# Patient Record
Sex: Male | Born: 1960 | Race: White | Hispanic: No | Marital: Married | Smoking: Never smoker
Health system: Southern US, Community
[De-identification: ages and names within clinical notes are randomized; demographics above are authoritative.]

## PROBLEM LIST (undated history)

## (undated) DIAGNOSIS — Z87442 Personal history of urinary calculi: Secondary | ICD-10-CM

## (undated) DIAGNOSIS — Z9889 Other specified postprocedural states: Secondary | ICD-10-CM

## (undated) DIAGNOSIS — J45909 Unspecified asthma, uncomplicated: Secondary | ICD-10-CM

## (undated) DIAGNOSIS — T8859XA Other complications of anesthesia, initial encounter: Secondary | ICD-10-CM

## (undated) DIAGNOSIS — T4145XA Adverse effect of unspecified anesthetic, initial encounter: Secondary | ICD-10-CM

## (undated) DIAGNOSIS — R112 Nausea with vomiting, unspecified: Secondary | ICD-10-CM

## (undated) HISTORY — PX: VARICOCELECTOMY: SHX1084

---

## 1998-07-17 HISTORY — PX: HERNIA REPAIR: SHX51

## 2001-07-17 HISTORY — PX: ANKLE SURGERY: SHX546

## 2003-11-18 ENCOUNTER — Encounter: Admission: RE | Admit: 2003-11-18 | Discharge: 2003-11-18 | Payer: Self-pay | Admitting: General Surgery

## 2003-11-19 ENCOUNTER — Ambulatory Visit (HOSPITAL_COMMUNITY): Admission: RE | Admit: 2003-11-19 | Discharge: 2003-11-19 | Payer: Self-pay | Admitting: General Surgery

## 2003-11-19 ENCOUNTER — Ambulatory Visit (HOSPITAL_BASED_OUTPATIENT_CLINIC_OR_DEPARTMENT_OTHER): Admission: RE | Admit: 2003-11-19 | Discharge: 2003-11-19 | Payer: Self-pay | Admitting: General Surgery

## 2007-07-18 HISTORY — PX: HERNIA REPAIR: SHX51

## 2007-12-20 ENCOUNTER — Encounter: Admission: RE | Admit: 2007-12-20 | Discharge: 2007-12-20 | Payer: Self-pay | Admitting: Internal Medicine

## 2010-12-02 NOTE — Op Note (Signed)
[name], [name]                          ACCOUNT NO.:  [id number]   MEDICAL RECORD NO.:  [id number]                   PATIENT TYPE:  AMB   LOCATION:  DSC                                  FACILITY:  [facility]   PHYSICIAN:  Ollen Gross. Vernell Morgans, M.D.              DATE OF BIRTH:  [birth year]   DATE OF PROCEDURE:  11/19/2003  DATE OF DISCHARGE:                                 OPERATIVE REPORT   PREOPERATIVE DIAGNOSIS:  Right inguinal hernia.   POSTOPERATIVE DIAGNOSIS:  Right direct inguinal hernia.   PROCEDURE:  Right inguinal hernia repair with mesh.   SURGEON:  Ollen Gross. Carolynne Edouard, M.D.   ANESTHESIA:  General endotracheal anesthesia.   PROCEDURE:  After informed consent was obtained, the patient was brought to  the operating room and placed in the supine position on the operating table.  After induction of general anesthesia, the patient's abdomen and right groin  were prepped with Betadine and draped in the usual sterile manner.  The area  of the right groin was infiltrated with 0.25% Marcaine with epinephrine.  A  small incision was made from the edge of the pubic tubercle on the right  towards the anterior superior iliac spine for a distance of about 5 cm.  This incision was carried down through the skin and subcutaneous tissue  sharply with electrocautery until the fascia of the external oblique was  encountered.  The external oblique fascia was opened along its fibers using  Metzenbaum scissors to the apex of the external ring.  A Weitlaner retractor  was then placed to retract the fascial layer superiorly and inferiorly.  Blunt dissection was carried out around the cord structures at the edge of  the pubic tubercle bluntly between fingers until the cord could be  surrounded between two fingers.  A 1/2 inch Penrose drain was then placed  around the cord structures for retraction purposes.  The cord was  skeletonized and there was no indirect component of hernia.  There was an  obvious  weakness of the floor of the inguinal canal and a broad based hernia  bulging through.  The hernia was able to be reduced and the floor of the  inguinal canal was repaired with interrupted 2-0 Vicryl stitches.  A piece  of 3 by 6 mesh was then cut to fit with tails cut laterally in the mesh.  The mesh was sewn inferiorly to the shelving edge of the inguinal ligament  using a running 2-0 Prolene stitch.  Superiorly, the muscle was sewn to the  muscular aponeurotic fascial layer of the transversalis using interrupted  vertical mattress 2-0 Prolene stitches, the tails were wrapped around the  cord structures and anchored laterally to the shelving edge of the inguinal  ligament with interrupted 2-0 Prolene stitch.  Once this was accomplished,  the repair was intact without any tension.  The mesh was in good position.  The wound was copiously irrigated with saline.  The external oblique fascia  was then reapproximated with a running 2-0 Vicryl stitch.  The wound was  infiltrated with the rest of the 0.25% Marcaine with epinephrine and the  subcutaneous fascia was closed with running 3-0 Vicryl.  The skin incision  was closed with a running 4-0 Monocryl subcuticular stitch.  Benzoin, Steri-  Strips, and sterile dressings were applied.  The patient tolerated the  procedure well.  At the end of the case, all needle, sponge, and instrument  counts were correct.  The patient was awakened and taken to the recovery  room in stable condition.                                               Ollen Gross. Vernell Morgans, M.D.    PST/MEDQ  D:  11/19/2003  T:  11/19/2003  Job:  [id number]

## 2013-09-16 ENCOUNTER — Other Ambulatory Visit: Payer: Self-pay | Admitting: Internal Medicine

## 2013-09-16 DIAGNOSIS — N6310 Unspecified lump in the right breast, unspecified quadrant: Secondary | ICD-10-CM

## 2013-09-24 ENCOUNTER — Ambulatory Visit
Admission: RE | Admit: 2013-09-24 | Discharge: 2013-09-24 | Disposition: A | Discharge status: Home / Self Care | Payer: Commercial Managed Care - PPO | Source: Ambulatory Visit | Attending: Internal Medicine | Admitting: Internal Medicine

## 2013-09-24 DIAGNOSIS — N6310 Unspecified lump in the right breast, unspecified quadrant: Secondary | ICD-10-CM

## 2016-05-15 ENCOUNTER — Other Ambulatory Visit (HOSPITAL_COMMUNITY): Payer: Self-pay | Admitting: Gastroenterology

## 2016-05-15 DIAGNOSIS — R1310 Dysphagia, unspecified: Secondary | ICD-10-CM

## 2016-05-23 ENCOUNTER — Ambulatory Visit (HOSPITAL_COMMUNITY)
Admission: RE | Admit: 2016-05-23 | Discharge: 2016-05-23 | Disposition: A | Discharge status: Home / Self Care | Payer: Commercial Managed Care - PPO | Source: Ambulatory Visit | Attending: Gastroenterology | Admitting: Gastroenterology

## 2016-05-23 DIAGNOSIS — R1310 Dysphagia, unspecified: Secondary | ICD-10-CM | POA: Diagnosis not present

## 2016-11-13 ENCOUNTER — Other Ambulatory Visit: Payer: Self-pay | Admitting: Urology

## 2016-11-21 ENCOUNTER — Encounter (HOSPITAL_COMMUNITY): Payer: Self-pay | Admitting: *Deleted

## 2016-11-23 ENCOUNTER — Encounter (HOSPITAL_COMMUNITY)
Admission: RE | Disposition: A | Discharge status: Home / Self Care | Payer: Self-pay | Source: Ambulatory Visit | Attending: Urology

## 2016-11-23 ENCOUNTER — Ambulatory Visit (HOSPITAL_COMMUNITY)
Admission: RE | Admit: 2016-11-23 | Discharge: 2016-11-23 | Disposition: A | Discharge status: Home / Self Care | Payer: Commercial Managed Care - PPO | Source: Ambulatory Visit | Attending: Urology | Admitting: Urology

## 2016-11-23 ENCOUNTER — Ambulatory Visit (HOSPITAL_COMMUNITY): Payer: Commercial Managed Care - PPO

## 2016-11-23 ENCOUNTER — Encounter (HOSPITAL_COMMUNITY): Payer: Self-pay

## 2016-11-23 DIAGNOSIS — J45909 Unspecified asthma, uncomplicated: Secondary | ICD-10-CM | POA: Diagnosis not present

## 2016-11-23 DIAGNOSIS — N200 Calculus of kidney: Secondary | ICD-10-CM | POA: Diagnosis not present

## 2016-11-23 DIAGNOSIS — Z7982 Long term (current) use of aspirin: Secondary | ICD-10-CM | POA: Diagnosis not present

## 2016-11-23 HISTORY — DX: Unspecified asthma, uncomplicated: J45.909

## 2016-11-23 HISTORY — DX: Nausea with vomiting, unspecified: R11.2

## 2016-11-23 HISTORY — DX: Personal history of urinary calculi: Z87.442

## 2016-11-23 HISTORY — DX: Adverse effect of unspecified anesthetic, initial encounter: T41.45XA

## 2016-11-23 HISTORY — DX: Other complications of anesthesia, initial encounter: T88.59XA

## 2016-11-23 HISTORY — PX: EXTRACORPOREAL SHOCK WAVE LITHOTRIPSY: SHX1557

## 2016-11-23 HISTORY — DX: Other specified postprocedural states: Z98.890

## 2016-11-23 SURGERY — LITHOTRIPSY, ESWL
Anesthesia: LOCAL | Laterality: Right

## 2016-11-23 MED ORDER — DIPHENHYDRAMINE HCL 25 MG PO CAPS
25.0000 mg | ORAL_CAPSULE | ORAL | Status: AC
  Administered 2016-11-23: 25 mg via ORAL
  Filled 2016-11-23: qty 1

## 2016-11-23 MED ORDER — SODIUM CHLORIDE 0.9 % IV SOLN
INTRAVENOUS | Status: DC
  Administered 2016-11-23: 08:00:00 via INTRAVENOUS

## 2016-11-23 MED ORDER — LEVOFLOXACIN 500 MG PO TABS
500.0000 mg | ORAL_TABLET | ORAL | Status: AC
  Administered 2016-11-23: 500 mg via ORAL
  Filled 2016-11-23: qty 1

## 2016-11-23 MED ORDER — DIAZEPAM 5 MG PO TABS
10.0000 mg | ORAL_TABLET | ORAL | Status: AC
  Administered 2016-11-23: 10 mg via ORAL
  Filled 2016-11-23: qty 2

## 2016-11-23 NOTE — Discharge Instructions (Signed)
Lithotripsy, Care After °This sheet gives you information about how to care for yourself after your procedure. Your health care provider may also give you more specific instructions. If you have problems or questions, contact your health care provider. °What can I expect after the procedure? °After the procedure, it is common to have: °· Some blood in your urine. This should only last for a few days. °· Soreness in your back, sides, or upper abdomen for a few days. °· Blotches or bruises on your back where the pressure wave entered the skin. °· Pain, discomfort, or nausea when pieces (fragments) of the kidney stone move through the tube that carries urine from the kidney to the bladder (ureter). Stone fragments may pass soon after the procedure, but they may continue to pass for up to 4-8 weeks. °? If you have severe pain or nausea, contact your health care provider. This may be caused by a large stone that was not broken up, and this may mean that you need more treatment. °· Some pain or discomfort during urination. °· Some pain or discomfort in the lower abdomen or (in men) at the base of the penis. ° °Follow these instructions at home: °Medicines °· Take over-the-counter and prescription medicines only as told by your health care provider. °· If you were prescribed an antibiotic medicine, take it as told by your health care provider. Do not stop taking the antibiotic even if you start to feel better. °· Do not drive for 24 hours if you were given a medicine to help you relax (sedative). °· Do not drive or use heavy machinery while taking prescription pain medicine. °Eating and drinking °· Drink enough water and fluids to keep your urine clear or pale yellow. This helps any remaining pieces of the stone to pass. It can also help prevent new stones from forming. °· Eat plenty of fresh fruits and vegetables. °· Follow instructions from your health care provider about eating and drinking restrictions. You may be  instructed: °? To reduce how much salt (sodium) you eat or drink. Check ingredients and nutrition facts on packaged foods and beverages. °? To reduce how much meat you eat. °· Eat the recommended amount of calcium for your age and gender. Ask your health care provider how much calcium you should have. °General instructions °· Get plenty of rest. °· Most people can resume normal activities 1-2 days after the procedure. Ask your health care provider what activities are safe for you. °· If directed, strain all urine through the strainer that was provided by your health care provider. °? Keep all fragments for your health care provider to see. Any stones that are found may be sent to a medical lab for examination. The stone may be as small as a grain of salt. °· Keep all follow-up visits as told by your health care provider. This is important. °Contact a health care provider if: °· You have pain that is severe or does not get better with medicine. °· You have nausea that is severe or does not go away. °· You have blood in your urine longer than your health care provider told you to expect. °· You have more blood in your urine. °· You have pain during urination that does not go away. °· You urinate more frequently than usual and this does not go away. °· You develop a rash or any other possible signs of an allergic reaction. °Get help right away if: °· You have severe pain in   your back, sides, or upper abdomen. °· You have severe pain while urinating. °· Your urine is very dark red. °· You have blood in your stool (feces). °· You cannot pass any urine at all. °· You feel a strong urge to urinate after emptying your bladder. °· You have a fever or chills. °· You develop shortness of breath, difficulty breathing, or chest pain. °· You have severe nausea that leads to persistent vomiting. °· You faint. °Summary °· After this procedure, it is common to have some pain, discomfort, or nausea when pieces (fragments) of the  kidney stone move through the tube that carries urine from the kidney to the bladder (ureter). If this pain or nausea is severe, however, you should contact your health care provider. °· Most people can resume normal activities 1-2 days after the procedure. Ask your health care provider what activities are safe for you. °· Drink enough water and fluids to keep your urine clear or pale yellow. This helps any remaining pieces of the stone to pass, and it can help prevent new stones from forming. °· If directed, strain your urine and keep all fragments for your health care provider to see. Fragments or stones may be as small as a grain of salt. °· Get help right away if you have severe pain in your back, sides, or upper abdomen or have severe pain while urinating. °This information is not intended to replace advice given to you by your health care provider. Make sure you discuss any questions you have with your health care provider. °Document Released: 07/23/2007 Document Revised: 05/24/2016 Document Reviewed: 05/24/2016 °Elsevier Interactive Patient Education © 2017 [employer] ° °

## 2016-11-24 ENCOUNTER — Encounter (HOSPITAL_COMMUNITY): Payer: Self-pay | Admitting: Urology

## 2018-01-02 DIAGNOSIS — R310 Gross hematuria: Secondary | ICD-10-CM | POA: Diagnosis not present

## 2018-01-02 DIAGNOSIS — N200 Calculus of kidney: Secondary | ICD-10-CM | POA: Diagnosis not present

## 2018-01-30 DIAGNOSIS — N200 Calculus of kidney: Secondary | ICD-10-CM | POA: Diagnosis not present

## 2018-01-30 DIAGNOSIS — R310 Gross hematuria: Secondary | ICD-10-CM | POA: Diagnosis not present

## 2018-03-08 DIAGNOSIS — R82998 Other abnormal findings in urine: Secondary | ICD-10-CM | POA: Diagnosis not present

## 2018-03-20 DIAGNOSIS — Z23 Encounter for immunization: Secondary | ICD-10-CM | POA: Diagnosis not present

## 2018-03-26 DIAGNOSIS — Z1212 Encounter for screening for malignant neoplasm of rectum: Secondary | ICD-10-CM | POA: Diagnosis not present

## 2018-07-31 DIAGNOSIS — N401 Enlarged prostate with lower urinary tract symptoms: Secondary | ICD-10-CM | POA: Diagnosis not present

## 2018-07-31 DIAGNOSIS — N200 Calculus of kidney: Secondary | ICD-10-CM | POA: Diagnosis not present

## 2018-07-31 DIAGNOSIS — R3912 Poor urinary stream: Secondary | ICD-10-CM | POA: Diagnosis not present

## 2019-07-28 ENCOUNTER — Ambulatory Visit (HOSPITAL_COMMUNITY): Payer: Managed Care, Other (non HMO) | Admitting: Certified Registered Nurse Anesthetist

## 2019-07-28 ENCOUNTER — Other Ambulatory Visit: Payer: Self-pay

## 2019-07-28 ENCOUNTER — Other Ambulatory Visit: Payer: Self-pay | Admitting: Gastroenterology

## 2019-07-28 ENCOUNTER — Encounter (HOSPITAL_COMMUNITY)
Admission: RE | Disposition: A | Discharge status: Home / Self Care | Payer: Self-pay | Source: Other Acute Inpatient Hospital | Attending: Gastroenterology

## 2019-07-28 ENCOUNTER — Encounter (HOSPITAL_COMMUNITY): Payer: Self-pay | Admitting: Gastroenterology

## 2019-07-28 ENCOUNTER — Ambulatory Visit (HOSPITAL_COMMUNITY)
Admission: RE | Admit: 2019-07-28 | Discharge: 2019-07-28 | Disposition: A | Discharge status: Home / Self Care | Payer: Managed Care, Other (non HMO) | Source: Other Acute Inpatient Hospital | Attending: Gastroenterology | Admitting: Gastroenterology

## 2019-07-28 DIAGNOSIS — K449 Diaphragmatic hernia without obstruction or gangrene: Secondary | ICD-10-CM | POA: Diagnosis not present

## 2019-07-28 DIAGNOSIS — K209 Esophagitis, unspecified without bleeding: Secondary | ICD-10-CM | POA: Diagnosis not present

## 2019-07-28 DIAGNOSIS — Z79899 Other long term (current) drug therapy: Secondary | ICD-10-CM | POA: Diagnosis not present

## 2019-07-28 DIAGNOSIS — Z87442 Personal history of urinary calculi: Secondary | ICD-10-CM | POA: Insufficient documentation

## 2019-07-28 DIAGNOSIS — Z7982 Long term (current) use of aspirin: Secondary | ICD-10-CM | POA: Insufficient documentation

## 2019-07-28 DIAGNOSIS — R1310 Dysphagia, unspecified: Secondary | ICD-10-CM | POA: Diagnosis not present

## 2019-07-28 DIAGNOSIS — J45909 Unspecified asthma, uncomplicated: Secondary | ICD-10-CM | POA: Diagnosis not present

## 2019-07-28 DIAGNOSIS — X58XXXA Exposure to other specified factors, initial encounter: Secondary | ICD-10-CM | POA: Diagnosis not present

## 2019-07-28 DIAGNOSIS — F419 Anxiety disorder, unspecified: Secondary | ICD-10-CM | POA: Diagnosis not present

## 2019-07-28 DIAGNOSIS — K760 Fatty (change of) liver, not elsewhere classified: Secondary | ICD-10-CM | POA: Diagnosis not present

## 2019-07-28 DIAGNOSIS — E785 Hyperlipidemia, unspecified: Secondary | ICD-10-CM | POA: Diagnosis not present

## 2019-07-28 DIAGNOSIS — T18128A Food in esophagus causing other injury, initial encounter: Secondary | ICD-10-CM | POA: Diagnosis not present

## 2019-07-28 DIAGNOSIS — R000 Tachycardia, unspecified: Secondary | ICD-10-CM | POA: Diagnosis not present

## 2019-07-28 DIAGNOSIS — K117 Disturbances of salivary secretion: Secondary | ICD-10-CM | POA: Insufficient documentation

## 2019-07-28 DIAGNOSIS — Z800 Family history of malignant neoplasm of digestive organs: Secondary | ICD-10-CM | POA: Insufficient documentation

## 2019-07-28 DIAGNOSIS — F329 Major depressive disorder, single episode, unspecified: Secondary | ICD-10-CM | POA: Insufficient documentation

## 2019-07-28 DIAGNOSIS — Z8249 Family history of ischemic heart disease and other diseases of the circulatory system: Secondary | ICD-10-CM | POA: Diagnosis not present

## 2019-07-28 DIAGNOSIS — Y939 Activity, unspecified: Secondary | ICD-10-CM | POA: Diagnosis not present

## 2019-07-28 HISTORY — PX: ESOPHAGOGASTRODUODENOSCOPY (EGD) WITH PROPOFOL: SHX5813

## 2019-07-28 HISTORY — PX: FOREIGN BODY REMOVAL: SHX962

## 2019-07-28 SURGERY — ESOPHAGOGASTRODUODENOSCOPY (EGD) WITH PROPOFOL
Anesthesia: General

## 2019-07-28 MED ORDER — SCOPOLAMINE 1 MG/3DAYS TD PT72
MEDICATED_PATCH | TRANSDERMAL | Status: AC
  Filled 2019-07-28: qty 1

## 2019-07-28 MED ORDER — PHENYLEPHRINE 40 MCG/ML (10ML) SYRINGE FOR IV PUSH (FOR BLOOD PRESSURE SUPPORT)
PREFILLED_SYRINGE | INTRAVENOUS | Status: DC | PRN
  Administered 2019-07-28 (×2): 120 ug via INTRAVENOUS
  Administered 2019-07-28 (×2): 80 ug via INTRAVENOUS

## 2019-07-28 MED ORDER — DIPHENHYDRAMINE HCL 50 MG/ML IJ SOLN
INTRAMUSCULAR | Status: DC | PRN
  Administered 2019-07-28: 12.5 mg via INTRAVENOUS

## 2019-07-28 MED ORDER — PROPOFOL 10 MG/ML IV BOLUS
INTRAVENOUS | Status: AC
  Filled 2019-07-28: qty 20

## 2019-07-28 MED ORDER — FENTANYL CITRATE (PF) 100 MCG/2ML IJ SOLN
INTRAMUSCULAR | Status: AC
  Filled 2019-07-28: qty 2

## 2019-07-28 MED ORDER — ONDANSETRON HCL 4 MG/2ML IJ SOLN
INTRAMUSCULAR | Status: DC | PRN
  Administered 2019-07-28: 4 mg via INTRAVENOUS

## 2019-07-28 MED ORDER — DEXAMETHASONE SODIUM PHOSPHATE 10 MG/ML IJ SOLN
INTRAMUSCULAR | Status: DC | PRN
  Administered 2019-07-28: 10 mg via INTRAVENOUS

## 2019-07-28 MED ORDER — LACTATED RINGERS IV SOLN
INTRAVENOUS | Status: DC
  Administered 2019-07-28: 1000 mL via INTRAVENOUS

## 2019-07-28 MED ORDER — PROPOFOL 500 MG/50ML IV EMUL
INTRAVENOUS | Status: AC
  Filled 2019-07-28: qty 100

## 2019-07-28 MED ORDER — PROPOFOL 500 MG/50ML IV EMUL
INTRAVENOUS | Status: DC | PRN
  Administered 2019-07-28: 150 ug/kg/min via INTRAVENOUS

## 2019-07-28 MED ORDER — LIDOCAINE 2% (20 MG/ML) 5 ML SYRINGE
INTRAMUSCULAR | Status: DC | PRN
  Administered 2019-07-28: 80 mg via INTRAVENOUS

## 2019-07-28 MED ORDER — PROPOFOL 10 MG/ML IV BOLUS
INTRAVENOUS | Status: DC | PRN
  Administered 2019-07-28: 180 mg via INTRAVENOUS

## 2019-07-28 MED ORDER — SUCCINYLCHOLINE CHLORIDE 200 MG/10ML IV SOSY
PREFILLED_SYRINGE | INTRAVENOUS | Status: DC | PRN
  Administered 2019-07-28: 100 mg via INTRAVENOUS

## 2019-07-28 MED ORDER — SCOPOLAMINE 1 MG/3DAYS TD PT72
1.0000 | MEDICATED_PATCH | TRANSDERMAL | Status: DC
  Administered 2019-07-28: 1.5 mg via TRANSDERMAL

## 2019-07-28 MED ORDER — OMEPRAZOLE 20 MG PO CPDR: 20.0000 mg | DELAYED_RELEASE_CAPSULE | Freq: Every day | ORAL | 1 refills | Status: AC

## 2019-07-28 MED ORDER — ESMOLOL HCL 100 MG/10ML IV SOLN
INTRAVENOUS | Status: DC | PRN
  Administered 2019-07-28: 20 ug via INTRAVENOUS

## 2019-07-28 MED ORDER — FENTANYL CITRATE (PF) 100 MCG/2ML IJ SOLN
INTRAMUSCULAR | Status: DC | PRN
  Administered 2019-07-28: 50 ug via INTRAVENOUS

## 2019-07-28 SURGICAL SUPPLY — 15 items

## 2019-07-28 NOTE — Discharge Instructions (Signed)
Endoscopy Care After Please read the instructions outlined below and refer to this sheet in the next few weeks. These discharge instructions provide you with general information on caring for yourself after you leave the hospital. Your doctor may also give you specific instructions. While your treatment has been planned according to the most current medical practices available, unavoidable complications occasionally occur. If you have any problems or questions after discharge, please call Dr. Dulce Sellar [facility]) at [phone].  HOME CARE INSTRUCTIONS Activity  You may resume your regular activity but move at a slower pace for the next 24 hours.   Take frequent rest periods for the next 24 hours.   Walking will help expel (get rid of) the air and reduce the bloated feeling in your abdomen.   No driving for 24 hours (because of the anesthesia (medicine) used during the test).   You may shower.   Do not sign any important legal documents or operate any machinery for 24 hours (because of the anesthesia used during the test).  Nutrition  Drink plenty of fluids.   Pureed or finely chopped foods only until further notice.  Avoid large chunks of fibrous meats like chicken, bacon, steak, pork until further notice  Avoid alcoholic beverages for 24 hours or as instructed by your caregiver.  Medications You may resume your normal medications unless your caregiver tells you otherwise. What you can expect today  You may experience abdominal discomfort such as a feeling of fullness or "gas" pains.   You may experience a sore throat for 2 to 3 days. This is normal. Gargling with salt water may help this.    SEEK IMMEDIATE MEDICAL CARE IF:  You have excessive nausea (feeling sick to your stomach) and/or vomiting.   You have severe abdominal pain and distention (swelling).   You have trouble swallowing.   You have a temperature over 100 F (37.8 C).   You have rectal  bleeding or vomiting of blood.  Document Released: 02/15/2004 Document Revised: 03/15/2011 Document Reviewed: 08/28/2007 [facility] Patient Information 2012 [place], [place].

## 2019-07-28 NOTE — Anesthesia Procedure Notes (Signed)
Procedure Name: Intubation Date/Time: 07/28/2019 1:29 PM Performed by: West Pugh, CRNA Pre-anesthesia Checklist: Patient identified, Emergency Drugs available, Suction available, Patient being monitored and Timeout performed Patient Re-evaluated:Patient Re-evaluated prior to induction Oxygen Delivery Method: Circle system utilized Preoxygenation: Pre-oxygenation with 100% oxygen Induction Type: IV induction, Rapid sequence and Cricoid Pressure applied Laryngoscope Size: Mac and 4 Grade View: Grade I Tube type: Oral Tube size: 7.5 mm Number of attempts: 1 Airway Equipment and Method: Stylet Placement Confirmation: ETT inserted through vocal cords under direct vision,  positive ETCO2,  CO2 detector and breath sounds checked- equal and bilateral Secured at: 22 cm Tube secured with: Tape Dental Injury: Teeth and Oropharynx as per pre-operative assessment

## 2019-07-28 NOTE — Op Note (Signed)
[facility] Patient Name: [name] Procedure Date: 07/28/2019 MRN: [id number] Attending MD: Willis Modena , MD Date of Birth: 31-Mar-1961 CSN: [id number] Age: 59 Admit Type: Outpatient Procedure:                Upper GI endoscopy Indications:              Dysphagia, Foreign body in the esophagus Providers:                Willis Modena, MD, Dwain Sarna, RN, Beryle Beams, Technician, Kym Groom, CRNA Referring MD:             Bernette Redbird, MD Medicines:                General Anesthesia Complications:            No immediate complications. Estimated Blood Loss:     Estimated blood loss: none. Procedure:                Pre-Anesthesia Assessment:                           - Prior to the procedure, a History and Physical                            was performed, and patient medications and                            allergies were reviewed. The patient's tolerance of                            previous anesthesia was also reviewed. The risks                            and benefits of the procedure and the sedation                            options and risks were discussed with the patient.                            All questions were answered, and informed consent                            was obtained. Prior Anticoagulants: The patient has                            taken no previous anticoagulant or antiplatelet                            agents. ASA Grade Assessment: II - A patient with                            mild systemic disease. After reviewing the risks  and benefits, the patient was deemed in                            satisfactory condition to undergo the procedure.                           After obtaining informed consent, the endoscope was                            passed under direct vision. Throughout the                            procedure, the patient's blood pressure, pulse, and                             oxygen saturations were monitored continuously. The                            GIF-H190 ([id number]) Olympus gastroscope was                            introduced through the mouth, and advanced to the                            second part of duodenum. The upper GI endoscopy was                            accomplished without difficulty. The patient                            tolerated the procedure well. Scope In: Scope Out: Findings:      LA Grade C (one or more mucosal breaks continuous between tops of 2 or       more mucosal folds, less than 75% circumference) esophagitis was found.      Mucosal changes including ringed esophagus were found in the middle       third of the esophagus and in the lower third of the esophagus.       Eosinophilic esophagitis?      Food was found in the middle third of the esophagus and in the lower       third of the esophagus. Removal of food was accomplished.      A small hiatal hernia was present.      The exam of the esophagus was otherwise normal.      The entire examined stomach was normal.      The duodenal bulb, first portion of the duodenum and second portion of       the duodenum were normal. Impression:               - LA Grade C esophagitis.                           - Esophageal mucosal changes.                           - Food in the middle third of  the esophagus and in                            the lower third of the esophagus. Removal was                            successful.                           - Small hiatal hernia.                           - Normal stomach.                           - Normal duodenal bulb, first portion of the                            duodenum and second portion of the duodenum. Moderate Sedation:      None Recommendation:           - Patient has a contact number available for                            emergencies. The signs and symptoms of potential                             delayed complications were discussed with the                            patient. Return to normal activities tomorrow.                            Written discharge instructions were provided to the                            patient.                           - Discharge patient to home (ambulatory).                           - Pureed diet until further notice.                           - Continue present medications.                           - Use Prilosec (omeprazole) 20 mg PO daily until                            further notice.                           - Repeat upper endoscopy at appointment to be  scheduled to evaluate the response to therapy.                           - Return to GI clinic at appointment to be                            scheduled.                           - Return to referring physician as previously                            scheduled. Procedure Code(s):        --- Professional ---                           [phone], Esophagogastroduodenoscopy, flexible,                            transoral; with removal of foreign body(s) Diagnosis Code(s):        --- Professional ---                           K20.90, Esophagitis, unspecified without bleeding                           K22.8, Other specified diseases of esophagus                           T18.128A, Food in esophagus causing other injury,                            initial encounter                           K44.9, Diaphragmatic hernia without obstruction or                            gangrene                           R13.10, Dysphagia, unspecified                           T18.108A, Unspecified foreign body in esophagus                            causing other injury, initial encounter CPT copyright 2019 American Medical Association. All rights reserved. The codes documented in this report are preliminary and upon coder review may  be revised to meet current compliance  requirements. Willis Modena, MD 07/28/2019 2:17:58 PM This report has been signed electronically. Number of Addenda: 0

## 2019-07-28 NOTE — Anesthesia Postprocedure Evaluation (Signed)
Anesthesia Post Note  Patient: [name]  Procedure(s) Performed: ESOPHAGOGASTRODUODENOSCOPY (EGD) WITH PROPOFOL (N/A )     Patient location during evaluation: PACU Anesthesia Type: General Level of consciousness: awake and alert, oriented and awake Pain management: pain level controlled Vital Signs Assessment: post-procedure vital signs reviewed and stable Respiratory status: spontaneous breathing, nonlabored ventilation, respiratory function stable and patient connected to nasal cannula oxygen Cardiovascular status: blood pressure returned to baseline and stable Postop Assessment: no apparent nausea or vomiting Anesthetic complications: no    Last Vitals:  Vitals:   07/28/19 1420 07/28/19 1430  BP: 92/60 (!) 129/97  Pulse: 93 97  Resp: 19 18  Temp: 37 C   SpO2: 99% 95%    Last Pain:  Vitals:   07/28/19 1420  TempSrc: Oral  PainSc: 0-No pain                 Cecile Hearing

## 2019-07-28 NOTE — Transfer of Care (Signed)
Immediate Anesthesia Transfer of Care Note  Patient: [name]  Procedure(s) Performed: ESOPHAGOGASTRODUODENOSCOPY (EGD) WITH PROPOFOL (N/A )  Patient Location: Endoscopy Unit  Anesthesia Type:General  Level of Consciousness: drowsy, patient cooperative and responds to stimulation  Airway & Oxygen Therapy: Patient Spontanous Breathing and Patient connected to face mask oxygen  Post-op Assessment: Report given to RN and Post -op Vital signs reviewed and stable  Post vital signs: Reviewed and stable  Last Vitals:  Vitals Value Taken Time  BP 92/60 07/28/19 1420  Temp    Pulse 92 07/28/19 1421  Resp 20 07/28/19 1421  SpO2 99 % 07/28/19 1421  Vitals shown include unvalidated device data.  Last Pain:  Vitals:   07/28/19 1420  TempSrc: Oral  PainSc:          Complications: No apparent anesthesia complications

## 2019-07-28 NOTE — Anesthesia Preprocedure Evaluation (Addendum)
Anesthesia Evaluation  Patient identified by MRN, date of birth, ID band Patient awake    Reviewed: Allergy & Precautions, NPO status , Patient's Chart, lab work & pertinent test results  History of Anesthesia Complications (+) PONV and history of anesthetic complications  Airway Mallampati: II  TM Distance: >3 FB Neck ROM: Full    Dental  (+) Teeth Intact, Dental Advisory Given   Pulmonary asthma ,    Pulmonary exam normal breath sounds clear to auscultation       Cardiovascular negative cardio ROS   Rhythm:Regular Rate:Tachycardia     Neuro/Psych negative neurological ROS  negative psych ROS   GI/Hepatic negative GI ROS, Neg liver ROS, Dysphagia, food impaction    Endo/Other  negative endocrine ROS  Renal/GU negative Renal ROS     Musculoskeletal negative musculoskeletal ROS (+)   Abdominal   Peds  Hematology negative hematology ROS (+)   Anesthesia Other Findings Day of surgery medications reviewed with the patient.  Reproductive/Obstetrics                            Anesthesia Physical Anesthesia Plan  ASA: II  Anesthesia Plan: General   Post-op Pain Management:    Induction: Intravenous and Cricoid pressure planned  PONV Risk Score and Plan: 3 and Dexamethasone, Ondansetron, TIVA and Scopolamine patch - Pre-op  Airway Management Planned: Oral ETT  Additional Equipment:   Intra-op Plan:   Post-operative Plan: Extubation in OR  Informed Consent: I have reviewed the patients History and Physical, chart, labs and discussed the procedure including the risks, benefits and alternatives for the proposed anesthesia with the patient or authorized representative who has indicated his/her understanding and acceptance.     Dental advisory given  Plan Discussed with: CRNA  Anesthesia Plan Comments:        Anesthesia Quick Evaluation

## 2019-07-28 NOTE — H&P (Signed)
Patient interval history reviewed.  Patient examined again.  There has been no change from documented H/P dated 07/28/2019 (scanned into chart from our office) except as documented below.  Assessment:  1.  Dysphagia. 2.  Sialorrhea, suspicion for esophageal food impaction since last night.  Plan:  1.  Endoscopy for possible esophageal foreign body removal. 2.  Risks (bleeding, infection, bowel perforation that could require surgery, sedation-related changes in cardiopulmonary systems), benefits (identification and possible treatment of source of symptoms, exclusion of certain causes of symptoms), and alternatives (watchful waiting, radiographic imaging studies, empiric medical treatment) of upper endoscopy with suspected food bolus removal (EGD +/- FBR) were explained to patient/family in detail and patient wishes to proceed.

## 2019-07-30 ENCOUNTER — Encounter: Payer: Self-pay | Admitting: *Deleted

## 2019-08-27 ENCOUNTER — Other Ambulatory Visit: Payer: Self-pay | Admitting: Urology

## 2019-08-27 DIAGNOSIS — N200 Calculus of kidney: Secondary | ICD-10-CM

## 2019-09-01 ENCOUNTER — Other Ambulatory Visit (HOSPITAL_COMMUNITY)
Admission: RE | Admit: 2019-09-01 | Discharge: 2019-09-01 | Disposition: A | Discharge status: Home / Self Care | Payer: Managed Care, Other (non HMO) | Source: Ambulatory Visit | Attending: Urology | Admitting: Urology

## 2019-09-01 DIAGNOSIS — Z01812 Encounter for preprocedural laboratory examination: Secondary | ICD-10-CM | POA: Insufficient documentation

## 2019-09-01 DIAGNOSIS — Z20822 Contact with and (suspected) exposure to covid-19: Secondary | ICD-10-CM | POA: Insufficient documentation

## 2019-09-01 LAB — SARS CORONAVIRUS 2 (TAT 6-24 HRS): SARS Coronavirus 2: NEGATIVE

## 2019-09-03 NOTE — Progress Notes (Signed)
Patient informed of weather delay tomorrow. Will arrive at 8:30am.

## 2019-09-04 ENCOUNTER — Other Ambulatory Visit: Payer: Self-pay

## 2019-09-04 ENCOUNTER — Ambulatory Visit (HOSPITAL_BASED_OUTPATIENT_CLINIC_OR_DEPARTMENT_OTHER)
Admission: RE | Admit: 2019-09-04 | Discharge: 2019-09-04 | Disposition: A | Discharge status: Home / Self Care | Payer: Managed Care, Other (non HMO) | Attending: Urology | Admitting: Urology

## 2019-09-04 ENCOUNTER — Encounter (HOSPITAL_BASED_OUTPATIENT_CLINIC_OR_DEPARTMENT_OTHER): Payer: Self-pay | Admitting: Urology

## 2019-09-04 ENCOUNTER — Ambulatory Visit (HOSPITAL_COMMUNITY): Payer: Managed Care, Other (non HMO)

## 2019-09-04 ENCOUNTER — Encounter (HOSPITAL_BASED_OUTPATIENT_CLINIC_OR_DEPARTMENT_OTHER)
Admission: RE | Disposition: A | Discharge status: Home / Self Care | Payer: Self-pay | Source: Home / Self Care | Attending: Urology

## 2019-09-04 DIAGNOSIS — N201 Calculus of ureter: Secondary | ICD-10-CM | POA: Diagnosis not present

## 2019-09-04 DIAGNOSIS — N200 Calculus of kidney: Secondary | ICD-10-CM

## 2019-09-04 DIAGNOSIS — Z79899 Other long term (current) drug therapy: Secondary | ICD-10-CM | POA: Insufficient documentation

## 2019-09-04 HISTORY — PX: EXTRACORPOREAL SHOCK WAVE LITHOTRIPSY: SHX1557

## 2019-09-04 SURGERY — LITHOTRIPSY, ESWL
Anesthesia: LOCAL | Laterality: Left

## 2019-09-04 MED ORDER — CIPROFLOXACIN HCL 500 MG PO TABS
500.0000 mg | ORAL_TABLET | ORAL | Status: AC
  Administered 2019-09-04: 500 mg via ORAL
  Filled 2019-09-04: qty 1

## 2019-09-04 MED ORDER — ONDANSETRON HCL 4 MG PO TABS: 4.0000 mg | ORAL_TABLET | Freq: Three times a day (TID) | ORAL | 0 refills | Status: AC | PRN

## 2019-09-04 MED ORDER — SCOPOLAMINE 1 MG/3DAYS TD PT72
1.0000 | MEDICATED_PATCH | TRANSDERMAL | Status: DC
  Administered 2019-09-04: 1.5 mg via TRANSDERMAL
  Filled 2019-09-04 (×2): qty 1

## 2019-09-04 MED ORDER — CIPROFLOXACIN HCL 500 MG PO TABS
ORAL_TABLET | ORAL | Status: AC
  Filled 2019-09-04: qty 1

## 2019-09-04 MED ORDER — DIAZEPAM 5 MG PO TABS
ORAL_TABLET | ORAL | Status: AC
  Filled 2019-09-04: qty 2

## 2019-09-04 MED ORDER — OXYCODONE HCL 5 MG PO TABS
ORAL_TABLET | ORAL | Status: AC
  Filled 2019-09-04: qty 1

## 2019-09-04 MED ORDER — DIPHENHYDRAMINE HCL 25 MG PO CAPS
ORAL_CAPSULE | ORAL | Status: AC
  Filled 2019-09-04: qty 1

## 2019-09-04 MED ORDER — TRAMADOL HCL 50 MG PO TABS: 50.0000 mg | ORAL_TABLET | Freq: Four times a day (QID) | ORAL | 0 refills | Status: AC | PRN

## 2019-09-04 MED ORDER — OXYCODONE HCL 5 MG PO TABS
5.0000 mg | ORAL_TABLET | ORAL | Status: DC | PRN
  Administered 2019-09-04: 5 mg via ORAL
  Filled 2019-09-04: qty 2

## 2019-09-04 MED ORDER — DIAZEPAM 5 MG PO TABS
10.0000 mg | ORAL_TABLET | ORAL | Status: AC
  Administered 2019-09-04: 10 mg via ORAL
  Filled 2019-09-04: qty 2

## 2019-09-04 MED ORDER — DIPHENHYDRAMINE HCL 25 MG PO CAPS
25.0000 mg | ORAL_CAPSULE | ORAL | Status: AC
  Administered 2019-09-04: 25 mg via ORAL
  Filled 2019-09-04: qty 1

## 2019-09-04 MED ORDER — TAMSULOSIN HCL 0.4 MG PO CAPS: 0.4000 mg | ORAL_CAPSULE | Freq: Every day | ORAL | 0 refills | Status: AC

## 2019-09-04 MED ORDER — SCOPOLAMINE 1 MG/3DAYS TD PT72
MEDICATED_PATCH | TRANSDERMAL | Status: AC
  Filled 2019-09-04: qty 1

## 2019-09-04 MED ORDER — SODIUM CHLORIDE 0.9 % IV SOLN
INTRAVENOUS | Status: DC
  Filled 2019-09-04: qty 1000

## 2019-09-04 NOTE — Op Note (Signed)
See [facility] OP note scanned into chart. Also because of the size, density, location and other factors that cannot be anticipated I feel this will likely be a staged procedure. This fact supersedes any indication in the scanned [facility] operative note to the contrary.  

## 2019-09-04 NOTE — Discharge Instructions (Signed)
Post Anesthesia Home Care Instructions  Activity: Get plenty of rest for the remainder of the day. A responsible adult should stay with you for 24 hours following the procedure.  For the next 24 hours, DO NOT: -Drive a car -Operate machinery -Drink alcoholic beverages -Take any medication unless instructed by your physician -Make any legal decisions or sign important papers.  Meals: Start with liquid foods such as gelatin or soup. Progress to regular foods as tolerated. Avoid greasy, spicy, heavy foods. If nausea and/or vomiting occur, drink only clear liquids until the nausea and/or vomiting subsides. Call your physician if vomiting continues.  Special Instructions/Symptoms: Your throat may feel dry or sore from the anesthesia or the breathing tube placed in your throat during surgery. If this causes discomfort, gargle with warm salt water. The discomfort should disappear within 24 hours.  If you had a scopolamine patch placed behind your ear for the management of post- operative nausea and/or vomiting:  1. The medication in the patch is effective for 72 hours, after which it should be removed.  Wrap patch in a tissue and discard in the trash. Wash hands thoroughly with soap and water. 2. You may remove the patch earlier than 72 hours if you experience unpleasant side effects which may include dry mouth, dizziness or visual disturbances. 3. Avoid touching the patch. Wash your hands with soap and water after contact with the patch.    Lithotripsy, Care After This sheet gives you information about how to care for yourself after your procedure. Your health care provider may also give you more specific instructions. If you have problems or questions, contact your health care provider. What can I expect after the procedure? After the procedure, it is common to have:  Some blood in your urine. This should only last for a few days.  Soreness in your back, sides, or upper abdomen for a few  days.  Blotches or bruises on your back where the pressure wave entered the skin.  Pain, discomfort, or nausea when pieces (fragments) of the kidney stone move through the tube that carries urine from the kidney to the bladder (ureter). Stone fragments may pass soon after the procedure, but they may continue to pass for up to 4-8 weeks. ? If you have severe pain or nausea, contact your health care provider. This may be caused by a large stone that was not broken up, and this may mean that you need more treatment.  Some pain or discomfort during urination.  Some pain or discomfort in the lower abdomen or (in men) at the base of the penis. Follow these instructions at home: Medicines  Take over-the-counter and prescription medicines only as told by your health care provider.  If you were prescribed an antibiotic medicine, take it as told by your health care provider. Do not stop taking the antibiotic even if you start to feel better.  Do not drive for 24 hours if you were given a medicine to help you relax (sedative).  Do not drive or use heavy machinery while taking prescription pain medicine. Eating and drinking      Drink enough water and fluids to keep your urine clear or pale yellow. This helps any remaining pieces of the stone to pass. It can also help prevent new stones from forming.  Eat plenty of fresh fruits and vegetables.  Follow instructions from your health care provider about eating and drinking restrictions. You may be instructed: ? To reduce how much salt (sodium) you   eat or drink. Check ingredients and nutrition facts on packaged foods and beverages. ? To reduce how much meat you eat.  Eat the recommended amount of calcium for your age and gender. Ask your health care provider how much calcium you should have. General instructions  Get plenty of rest.  Most people can resume normal activities 1-2 days after the procedure. Ask your health care provider what  activities are safe for you.  Your health care provider may direct you to lie in a certain position (postural drainage) and tap firmly (percuss) over your kidney area to help stone fragments pass. Follow instructions as told by your health care provider.  If directed, strain all urine through the strainer that was provided by your health care provider. ? Keep all fragments for your health care provider to see. Any stones that are found may be sent to a medical lab for examination. The stone may be as small as a grain of salt.  Keep all follow-up visits as told by your health care provider. This is important. Contact a health care provider if:  You have pain that is severe or does not get better with medicine.  You have nausea that is severe or does not go away.  You have blood in your urine longer than your health care provider told you to expect.  You have more blood in your urine.  You have pain during urination that does not go away.  You urinate more frequently than usual and this does not go away.  You develop a rash or any other possible signs of an allergic reaction. Get help right away if:  You have severe pain in your back, sides, or upper abdomen.  You have severe pain while urinating.  Your urine is very dark red.  You have blood in your stool (feces).  You cannot pass any urine at all.  You feel a strong urge to urinate after emptying your bladder.  You have a fever or chills.  You develop shortness of breath, difficulty breathing, or chest pain.  You have severe nausea that leads to persistent vomiting.  You faint. Summary  After this procedure, it is common to have some pain, discomfort, or nausea when pieces (fragments) of the kidney stone move through the tube that carries urine from the kidney to the bladder (ureter). If this pain or nausea is severe, however, you should contact your health care provider.  Most people can resume normal activities 1-2  days after the procedure. Ask your health care provider what activities are safe for you.  Drink enough water and fluids to keep your urine clear or pale yellow. This helps any remaining pieces of the stone to pass, and it can help prevent new stones from forming.  If directed, strain your urine and keep all fragments for your health care provider to see. Fragments or stones may be as small as a grain of salt.  Get help right away if you have severe pain in your back, sides, or upper abdomen or have severe pain while urinating. This information is not intended to replace advice given to you by your health care provider. Make sure you discuss any questions you have with your health care provider. Document Revised: 10/14/2018 Document Reviewed: 05/24/2016 Elsevier Patient Education  2020 [employer] See [facility] discharge instructions in chart. 

## 2019-09-04 NOTE — H&P (Signed)
I have blood in my urine.     11.24.2020: He presents today with recurrence of microscopic hematuria noted on recent PCP labs as well as on urinalysis today. Her the past couple months and usually only with exertional activities as previously noted on prior office visit notes, the patient would have some mild gross hematuria described as rust-colored urine that would quickly resolved when he wasn't being active. He is also been having some mild intermittent right flank discomfort as well as left lower quadrant abdominal discomfort occurring without modifying or alleviating factors. He denies any recent stone material passage. Voiding at his baseline with occasional weak stream in the morning but otherwise no bothersome frequency/urgency, nocturia, burning or painful urination.   1.6.2021: He was here today for evaluative cysto for gross hematuria -- this is no longer needed given a left sided KS was found on recent imaging.    CC: I have kidney stones.  HPI: [name] is a 59 year-old male established patient who is here for renal calculi.  He is not currently having flank pain, back pain, groin pain, nausea, vomiting, fever or chills.   No recent flank or back pain. No recent hematuria. He has occasional/rare inability to start his stream in the morning.   12.3.2020: 12x8x6 mm left renal pelvic stone seen on CT.   1.6.2020: Here today for follow-up on left stone and to plan treatment. There is no hydro and he is not symptomatic.     ALLERGIES: No Allergies    MEDICATIONS: Aspir 81  Rosuvastatin Calcium 20 mg tablet  Zinc     GU PSH: Cystoscopy - 2018, 2017 ESWL - 2018 Locm 300-399Mg /Ml Iodine,1Ml - 06/19/2019, 2017     NON-GU PSH: Hernia Repair     GU PMH: History of urolithiasis - 06/10/2019 BPH w/LUTS, He does have intermittent lower urinary tract symptoms, currently quiescent - 07/31/2018 Renal calculus, No evidence of current urolithiasis - 07/31/2018, - 01/30/2018  (Improving), Right, - 2018 (Worsening), Right renal calculus, symptomatic with gross hematuria and intermittent pain, - 2018 (Stable), 4 mm right renal stone may be causing his hematuria. It is otherwise asymptomatic, - 2018, - 2017 Weak Urinary Stream - 07/31/2018 Gross hematuria, His gross hematuria has been persistent, not associated with pain, associated with heavy activity. This may well be related to his right-sided renal calculus, even though it is only 4 mm in size. The location of the stone may well precipitate the hematuria. - 2018, - 2017    NON-GU PMH: No Non-GU PMH    FAMILY HISTORY: No Family History    SOCIAL HISTORY: Marital Status: Married Preferred Language: English; Ethnicity: Not Hispanic Or Latino; Race: White Current Smoking Status: Patient has never smoked.  Does not use smokeless tobacco. Does drink.  Does not drink caffeine.    REVIEW OF SYSTEMS:    GU Review Male:   Patient reports hard to postpone urination. Patient denies frequent urination, burning/ pain with urination, get up at night to urinate, leakage of urine, stream starts and stops, trouble starting your stream, have to strain to urinate , erection problems, and penile pain.  Gastrointestinal (Upper):   Patient denies vomiting, indigestion/ heartburn, and nausea.  Gastrointestinal (Lower):   Patient denies diarrhea and constipation.  Constitutional:   Patient denies fever, night sweats, weight loss, and fatigue.  Skin:   Patient denies skin rash/ lesion and itching.  Eyes:   Patient denies blurred vision and double vision.  Ears/ Nose/ Throat:   Patient  denies sore throat and sinus problems.  Hematologic/Lymphatic:   Patient denies swollen glands and easy bruising.  Cardiovascular:   Patient denies leg swelling and chest pains.  Respiratory:   Patient denies cough and shortness of breath.  Endocrine:   Patient denies excessive thirst.  Musculoskeletal:   Patient denies back pain and joint pain.   Neurological:   Patient denies headaches and dizziness.  Psychologic:   Patient denies depression and anxiety.   VITAL SIGNS:      07/23/2019 02:09 PM  Weight 175 lb / 79.38 kg  Height 70 in / 177.8 cm  BP 165/115 mmHg  Pulse 132 /min  Temperature 98.1 F / 36.7 C  BMI 25.1 kg/m   MULTI-SYSTEM PHYSICAL EXAMINATION:    Constitutional: Well-nourished. No physical deformities. Normally developed. Good grooming.  Neck: Neck symmetrical, not swollen. Normal tracheal position.  Respiratory: No labored breathing, no use of accessory muscles.   Skin: No paleness, no jaundice, no cyanosis. No lesion, no ulcer, no rash.  Neurologic / Psychiatric: Oriented to time, oriented to place, oriented to person. No depression, no anxiety, no agitation.  Eyes: Normal conjunctivae. Normal eyelids.  Ears, Nose, Mouth, and Throat: Left ear no scars, no lesions, no masses. Right ear no scars, no lesions, no masses. Nose no scars, no lesions, no masses. Normal hearing. Normal lips.  Musculoskeletal: Normal gait and station of head and neck.     PAST DATA REVIEWED:  Source Of History:  Patient  Records Review:   Previous Patient Records  Urine Test Review:   Urinalysis  X-Ray Review: C.T. Hematuria: Reviewed Films. Reviewed Report. Discussed With Patient.     05/20/19 03/08/18  PSA  Total PSA 1.038 ng/dl 0.973 ng/dl    PROCEDURES:         KUB - 74018  A single view of the abdomen is obtained. Bowel gas/soft tissues/bony structures normal. 12 mm calcification in the area of the left renal pelvis most likely stone.      . Patient confirmed No Neulasta OnPro Device.           Urinalysis w/Scope Dipstick Dipstick Cont'd Micro  Color: Yellow Bilirubin: Neg mg/dL WBC/hpf: 0 - 5/hpf  Appearance: Clear Ketones: Neg mg/dL RBC/hpf: 3 - 53/GDJ  Specific Gravity: 1.010 Blood: 3+ ery/uL Bacteria: Rare (0-9/hpf)  pH: 5.5 Protein: Trace mg/dL Cystals: NS (Not Seen)  Glucose: Neg mg/dL Urobilinogen: 0.2  mg/dL Casts: NS (Not Seen)    Nitrites: Neg Trichomonas: Not Present    Leukocyte Esterase: Neg leu/uL Mucous: Not Present      Epithelial Cells: NS (Not Seen)      Yeast: NS (Not Seen)      Sperm: Not Present    ASSESSMENT:      ICD-10 Details  1 GU:   Renal calculus - N20.0 Left, Undiagnosed New Problem - HU 650 left renal pevlic stone SSD 10.6 cm. Not current showing sx's and no apparent hydro. KUB today has stone visible -- he seems to be a good candidate for ESWL.   2   Gross hematuria - R31.0 Chronic, Stable - No need for further evaluation -- more than likely due to his KS.    PLAN:           Orders X-Rays: KUB          Schedule X-Rays: 3 Months - KUB  Return Visit/Planned Activity: 3 Months - Office Visit          Document Letter(s):  Created  for Patient: Clinical Summary         Notes:   1. Discussed treatment options for his stone including ESWL which he tolerated very well for his previous stone.   2. He was assured that this stone is okay to delay treating for given he is not currently symptomatic and treatment may currently be cost prohibitive.   3. Return in 3 mo's with repeat KUB and to discuss scheduling for KUB.

## 2019-09-09 NOTE — H&P (Signed)
have had kidney stone surgery.  HPI: [name] is a 59 year-old male established patient who is here for renal calculi after a surgical intervention.  09/05/2019: Pt had ESWL to treat a left renal pelvis calculi yesterday. He developed worsening symptoms after the procedure including renal colic, increased urinary urgency, difficulty starting his stream, mild nausea. He states only passing a few stone pieces after the procedure. He appears uncomfortable today. Pain primarily now in the lower abdomen associated with painful urgency. He has been taking tamsulosin, tramadol, and 1000mg  tylenol.  Tylenol has been most effective, he states tramadol seems to make it worse. He denies f/c, gross hematuria, dysuria with voiding.   The stone was on the left side. He had ESWL for treatment of his renal calculi. Patient denies Stent, Ureteroscopy, and PCNL. This procedure was done approximately 09/04/2019. He did pass a stone since the last office visit . This is not his first kidney stone. He does not have a stent in place.   He is currently having flank pain, back pain, groin pain, and nausea. He denies having vomiting, fever, and chills.   He does not have dysuria. He does have urgency. He does have frequency.     ALLERGIES: None   MEDICATIONS: Aspir 81  Flomax 0.4 mg capsule  Rosuvastatin Calcium 20 mg tablet  Tramadol Hcl  Zinc     GU PSH: Cystoscopy - 2018, 2017 ESWL - 2018 Locm 300-399Mg /Ml Iodine,1Ml - 06/19/2019, 2017     NON-GU PSH: Hernia Repair     GU PMH: Gross hematuria, No need for further evaluation -- more than likely due to his KS. - 07/23/2019, His gross hematuria has been persistent, not associated with pain, associated with heavy activity. This may well be related to his right-sided renal calculus, even though it is only 4 mm in size. The location of the stone may well precipitate the hematuria., - 2018, - 2017 Renal calculus, HU 650 left renal pevlic stone SSD 10.6 cm. Not  current showing sx's and no apparent hydro. KUB today has stone visible -- he seems to be a good candidate for ESWL. - 07/23/2019, No evidence of current urolithiasis, - 07/31/2018, - 01/30/2018 (Improving), Right, - 2018 (Worsening), Right renal calculus, symptomatic with gross hematuria and intermittent pain, - 2018 (Stable), 4 mm right renal stone may be causing his hematuria. It is otherwise asymptomatic, - 2018, - 2017 History of urolithiasis - 06/10/2019 BPH w/LUTS, He does have intermittent lower urinary tract symptoms, currently quiescent - 07/31/2018 Weak Urinary Stream - 07/31/2018    NON-GU PMH: None   FAMILY HISTORY: None   SOCIAL HISTORY: Marital Status: Married Preferred Language: English; Ethnicity: Not Hispanic Or Latino; Race: White Current Smoking Status: Patient has never smoked.  Does not use smokeless tobacco. Does drink.  Does not drink caffeine.    REVIEW OF SYSTEMS:    GU Review Male:   Patient denies frequent urination, hard to postpone urination, burning/ pain with urination, get up at night to urinate, leakage of urine, stream starts and stops, trouble starting your stream, have to strain to urinate , erection problems, and penile pain.  Gastrointestinal (Upper):   Patient denies nausea, vomiting, and indigestion/ heartburn.  Gastrointestinal (Lower):   Patient denies diarrhea and constipation.  Constitutional:   Patient denies fever, night sweats, weight loss, and fatigue.  Skin:   Patient denies skin rash/ lesion and itching.  Eyes:   Patient denies blurred vision and double vision.  Ears/ Nose/ Throat:  Patient denies sore throat and sinus problems.  Hematologic/Lymphatic:   Patient denies swollen glands and easy bruising.  Cardiovascular:   Patient denies leg swelling and chest pains.  Respiratory:   Patient denies cough and shortness of breath.  Endocrine:   Patient denies excessive thirst.  Musculoskeletal:   Patient denies back pain and joint pain.   Neurological:   Patient denies headaches and dizziness.  Psychologic:   Patient denies depression and anxiety.   VITAL SIGNS:      09/05/2019 11:26 AM  BP 171/89 mmHg  Pulse 81 /min  Temperature 96.8 F / 36 C   MULTI-SYSTEM PHYSICAL EXAMINATION:    Constitutional: Well-nourished. No physical deformities. Normally developed. Good grooming.  Neck: Neck symmetrical, not swollen. Normal tracheal position.  Respiratory: No labored breathing, no use of accessory muscles.   Cardiovascular: Normal temperature, normal extremity pulses, no swelling, no varicosities.  Skin: No paleness, no jaundice, no cyanosis. No lesion, no ulcer, no rash.  Neurologic / Psychiatric: Oriented to time, oriented to place, oriented to person. No depression, no anxiety, no agitation.  Gastrointestinal: No mass, no tenderness, no rigidity, non obese abdomen. No CVA or flank tenderness. Mild tender overlying the LLQ of the abdomen and S/p area.   Musculoskeletal: Normal gait and station of head and neck.     PAST DATA REVIEWED:  Source Of History:  Patient, Family/Caregiver, Medical Record Summary  Records Review:   Previous Hospital Records, Previous Patient Records  X-Ray Review: KUB: Reviewed Films. Discussed With Patient.  C.T. Hematuria: Reviewed Films. Reviewed Report.     05/20/19 03/08/18  PSA  Total PSA 1.038 ng/dl 2.263 ng/dl    PROCEDURES:         KUB - 74018  A single view of the abdomen is obtained. Faint Steinstrasse of stone material extending 2.5cm proximally from the left uvj/uo area. No other obvious ureteral calculi identified. The previously treated left renal pelvis stone is not visible.      Patient confirmed No Neulasta OnPro Device.           Ketoralac 60mg  - , [id number] Skin was prepped with alcohol. Site was injected. Pt tolerated well.   Qty: 60 Adm. By: [id number]  Unit: mg Lot No Julien Nordmann  Route: IM Exp. Date 11/13/2020  Freq: None Mfgr.:   Site: Left Buttock          Phenergan 25mg  - 11/15/2020, [id number] Qty: 25 Adm. By:  Unit: mg Lot No [id number]  Route: IM Exp. Date 09/13/2020  Freq: None Mfgr.:   Site: Right Buttock   ASSESSMENT:      ICD-10 Details  1 GU:   Renal calculus - N20.0 Left, Acute, Systemic Symptoms  2   Renal colic - N23 Acute, Systemic Symptoms   PLAN:            Medications New Meds: Ketorolac Tromethamine 10 mg tablet 1 tablet PO Q 8 H PRN For severe pain  #20  0 Refill(s)  Ondansetron Hcl 4 mg tablet 1 tablet PO Q 6 H PRN   #20  0 Refill(s)  Oxycodone-Acetaminophen 5 mg-325 mg tablet 1 tablet PO Q 6 H PRN   #20  0 Refill(s)            Orders X-Rays: KUB          Schedule Return Visit/Planned Activity: Keep Scheduled Appointment - Office Visit          Document Letter(s):  Created for Patient:  Clinical Summary         Notes:   He was given an injection of toradol and phenergan which has began to ease the severity of his pain. He was then taken to xray for a KUB.   KUB demonstrating a Steinstrasse of fragmented stone material extending ~2.5cm from the left UVJ/UO area. He had significant relief of symptoms with injections administered here in clinic. I'm going to provide an RX for the oral preparation of Toradol today to take q8h prn for moderate to severe pain. Percocet rx'd to take in favor of tramadol as well. Instructions on use discussed. He will use sparingly. He can continue tylenol as well but understands to not take more than 3000mg  in a 24 hour period. He will continue tamsulosin. He has already taken a dose this morning and I want him to take this again tonight and continue BID through the weekend. He has a whirlpool bathtub and I recommended soaking in the hot water to help relax the muscles in the pelvis which will aid in stone passage. He was provided Zofran to take for nasuea.   He understands to try and walk around some to help with stone material passage. He understands to remain well hydrated and  nourished. ED f/u instructions given for continued or worsening symptoms including uncontrolled pain, uncontrolled n/v, painful inability to void. I told him I can see him back next week if needed for evaluation of continued symptoms. Otherwise he will keep f/u next month for repeat exam.

## 2021-08-19 IMAGING — DX DG ABDOMEN 1V
2 series · 2 of 2 positions shown · non-contrast
Comparison: Abdomen 07/23/2019.  CT 06/19/2019.

CLINICAL DATA: Pre lithotripsy.

EXAM:
ABDOMEN - 1 VIEW

[abdomen kub (1 of 2)]
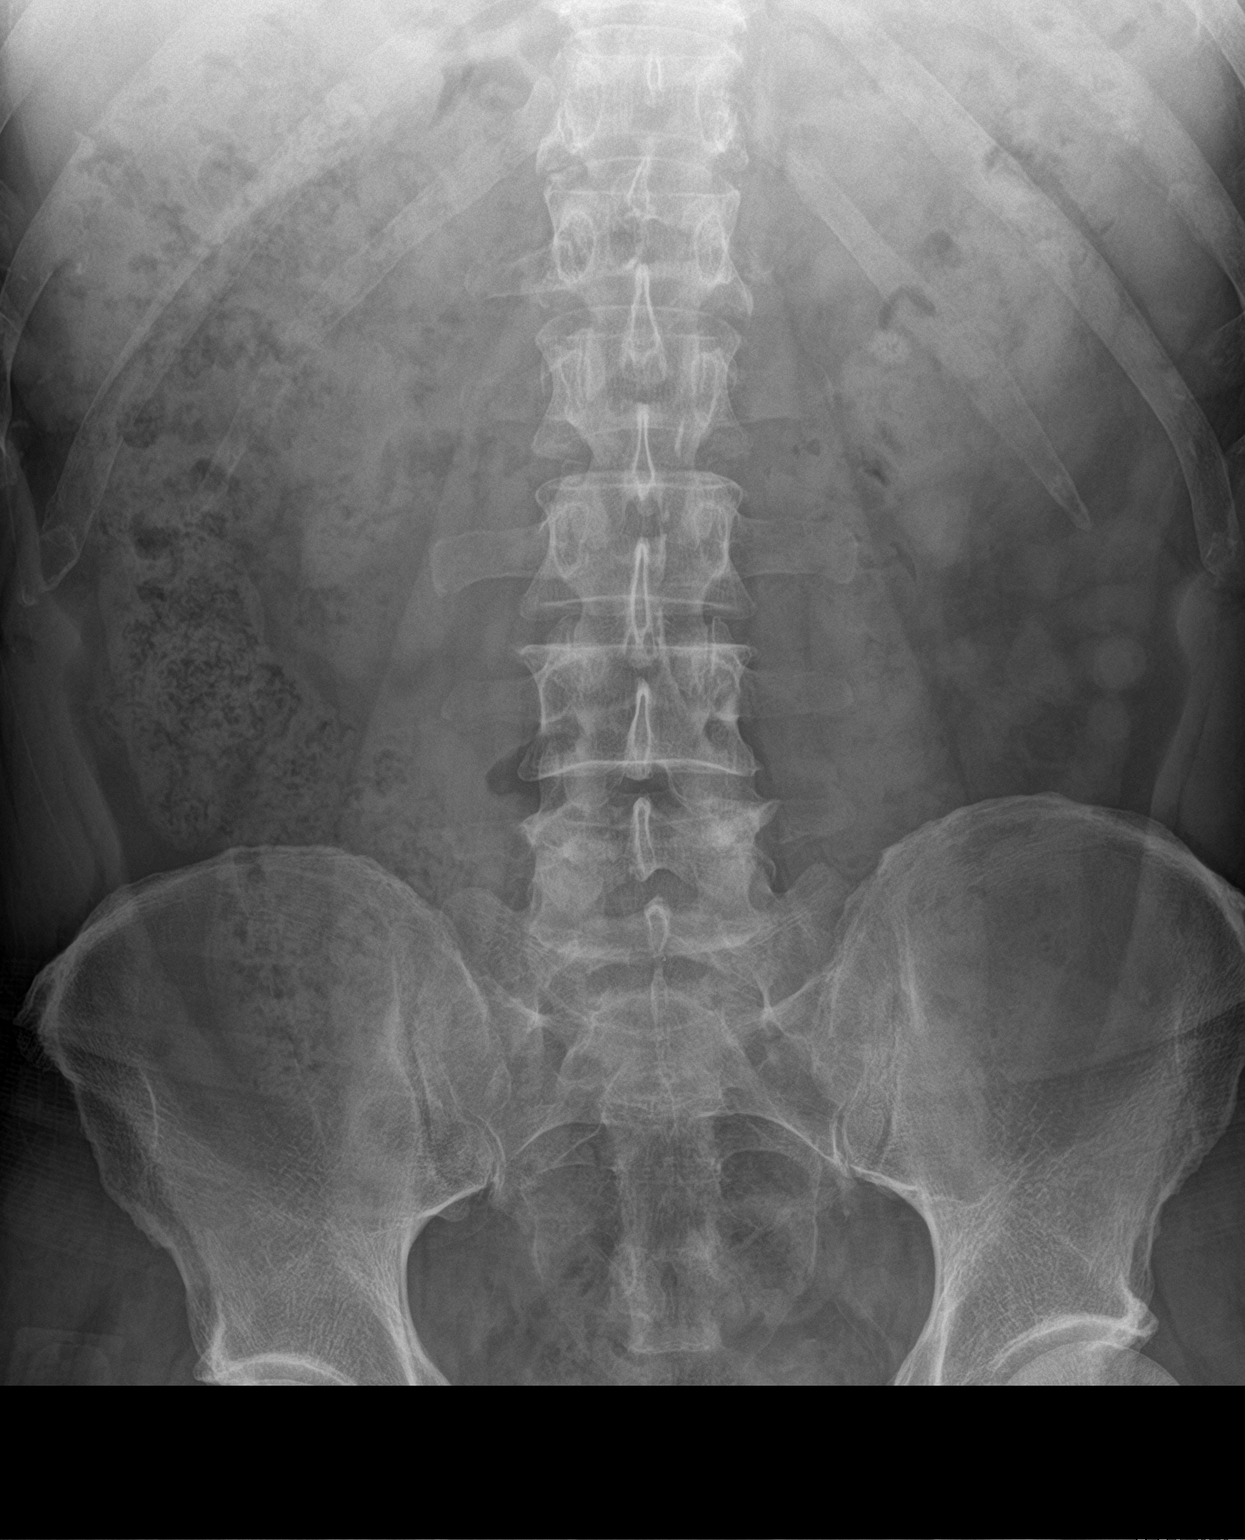

[abdomen kub (2 of 2)]
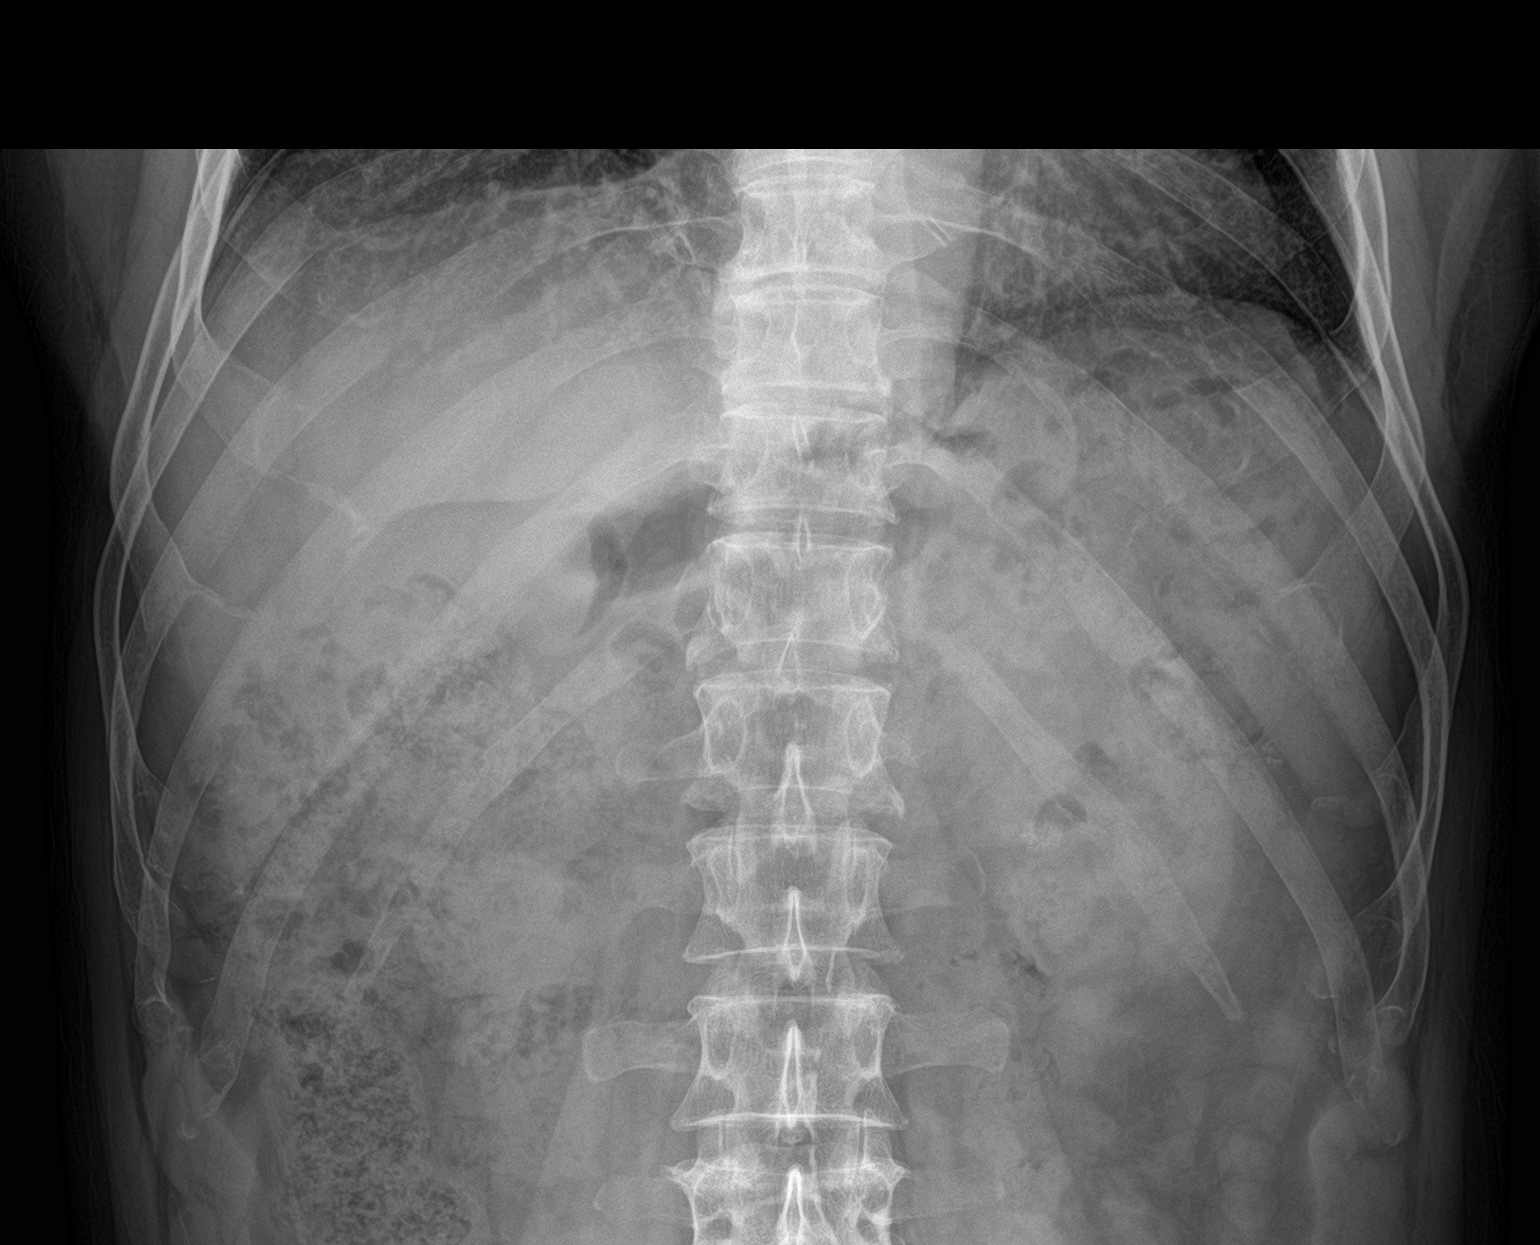

[2 of 2 positions shown; findings below may reference images not displayed]

FINDINGS: Prominent calcific density noted over the left kidney consistent
with left nephrolithiasis. No evidence of ureteral stone. Vascular
calcifications in the pelvis. Stool noted throughout the colon. No
acute bony abnormality.
IMPRESSION: Prominent calcific density again noted the left kidney consistent
left nephrolithiasis. Similar finding noted on prior studies.
